# Patient Record
Sex: Female | Born: 1935 | Race: White | Hispanic: No | Marital: Single
Health system: Southern US, Community
[De-identification: ages and names within clinical notes are randomized; demographics above are authoritative.]

---

## 2003-02-05 ENCOUNTER — Other Ambulatory Visit: Payer: Self-pay

## 2003-02-06 ENCOUNTER — Other Ambulatory Visit: Payer: Self-pay

## 2004-03-25 ENCOUNTER — Ambulatory Visit: Payer: Self-pay | Admitting: Family Medicine

## 2005-07-16 ENCOUNTER — Emergency Department: Payer: Self-pay | Admitting: Emergency Medicine

## 2006-05-04 ENCOUNTER — Ambulatory Visit: Payer: Self-pay | Admitting: Family Medicine

## 2007-12-28 ENCOUNTER — Ambulatory Visit: Payer: Self-pay | Admitting: Family Medicine

## 2008-01-03 ENCOUNTER — Ambulatory Visit: Payer: Self-pay | Admitting: Ophthalmology

## 2008-01-18 ENCOUNTER — Ambulatory Visit: Payer: Self-pay | Admitting: Ophthalmology

## 2008-10-25 ENCOUNTER — Ambulatory Visit: Payer: Self-pay | Admitting: Cardiology

## 2008-12-31 ENCOUNTER — Ambulatory Visit: Payer: Self-pay | Admitting: Family Medicine

## 2010-01-27 ENCOUNTER — Inpatient Hospital Stay: Payer: Self-pay | Admitting: Orthopedic Surgery

## 2010-07-03 ENCOUNTER — Ambulatory Visit: Payer: Self-pay | Admitting: Ophthalmology

## 2010-07-15 ENCOUNTER — Ambulatory Visit: Payer: Self-pay | Admitting: Ophthalmology

## 2012-03-31 DIAGNOSIS — I4891 Unspecified atrial fibrillation: Secondary | ICD-10-CM

## 2012-03-31 DIAGNOSIS — K625 Hemorrhage of anus and rectum: Secondary | ICD-10-CM

## 2012-03-31 DIAGNOSIS — I10 Essential (primary) hypertension: Secondary | ICD-10-CM

## 2012-03-31 DIAGNOSIS — M159 Polyosteoarthritis, unspecified: Secondary | ICD-10-CM

## 2012-04-07 DIAGNOSIS — L97309 Non-pressure chronic ulcer of unspecified ankle with unspecified severity: Secondary | ICD-10-CM

## 2012-04-29 DIAGNOSIS — H6120 Impacted cerumen, unspecified ear: Secondary | ICD-10-CM

## 2012-05-26 DIAGNOSIS — I4891 Unspecified atrial fibrillation: Secondary | ICD-10-CM

## 2012-05-26 DIAGNOSIS — I10 Essential (primary) hypertension: Secondary | ICD-10-CM

## 2012-05-26 DIAGNOSIS — I872 Venous insufficiency (chronic) (peripheral): Secondary | ICD-10-CM

## 2012-05-26 DIAGNOSIS — M159 Polyosteoarthritis, unspecified: Secondary | ICD-10-CM

## 2012-06-06 ENCOUNTER — Observation Stay: Payer: Self-pay | Admitting: Internal Medicine

## 2012-06-06 LAB — COMPREHENSIVE METABOLIC PANEL
Alkaline Phosphatase: 80 U/L (ref 50–136)
Co2: 26 mmol/L (ref 21–32)
Creatinine: 0.54 mg/dL — ABNORMAL LOW (ref 0.60–1.30)
EGFR (African American): >60
Osmolality: 284 (ref 275–301)
SGPT (ALT): 17 U/L (ref 12–78)
Sodium: 141 mmol/L (ref 136–145)
Total Protein: 6.1 g/dL — ABNORMAL LOW (ref 6.4–8.2)

## 2012-06-06 LAB — URINALYSIS, COMPLETE
Bilirubin,UR: NEGATIVE
Blood: NEGATIVE
Ketone: NEGATIVE
Ph: 5 (ref 4.5–8.0)

## 2012-06-06 LAB — CBC
HCT: 41.4 % (ref 35.0–47.0)
HGB: 14 g/dL (ref 12.0–16.0)
MCH: 33.5 pg (ref 26.0–34.0)
MCHC: 33.9 g/dL (ref 32.0–36.0)
Platelet: 172 10*3/uL (ref 150–440)
RDW: 13.7 % (ref 11.5–14.5)
WBC: 4.9 10*3/uL (ref 3.6–11.0)

## 2012-06-06 LAB — TROPONIN I: Troponin-I: <0.02 ng/mL

## 2012-06-06 LAB — PROTIME-INR: INR: 2

## 2012-06-08 DIAGNOSIS — R0902 Hypoxemia: Secondary | ICD-10-CM

## 2012-06-08 DIAGNOSIS — I6789 Other cerebrovascular disease: Secondary | ICD-10-CM

## 2012-06-08 DIAGNOSIS — M129 Arthropathy, unspecified: Secondary | ICD-10-CM

## 2012-06-10 DIAGNOSIS — Z5181 Encounter for therapeutic drug level monitoring: Secondary | ICD-10-CM

## 2012-06-24 DIAGNOSIS — Z5181 Encounter for therapeutic drug level monitoring: Secondary | ICD-10-CM

## 2012-07-05 ENCOUNTER — Ambulatory Visit: Payer: Self-pay | Admitting: Internal Medicine

## 2012-07-14 DIAGNOSIS — F411 Generalized anxiety disorder: Secondary | ICD-10-CM

## 2012-07-14 DIAGNOSIS — I10 Essential (primary) hypertension: Secondary | ICD-10-CM

## 2012-07-14 DIAGNOSIS — F0150 Vascular dementia without behavioral disturbance: Secondary | ICD-10-CM

## 2012-07-14 DIAGNOSIS — I4891 Unspecified atrial fibrillation: Secondary | ICD-10-CM

## 2012-07-19 DIAGNOSIS — L97309 Non-pressure chronic ulcer of unspecified ankle with unspecified severity: Secondary | ICD-10-CM

## 2012-08-03 DIAGNOSIS — Z5181 Encounter for therapeutic drug level monitoring: Secondary | ICD-10-CM

## 2012-09-12 ENCOUNTER — Encounter (HOSPITAL_BASED_OUTPATIENT_CLINIC_OR_DEPARTMENT_OTHER): Payer: Medicare Other | Attending: General Surgery

## 2012-09-12 DIAGNOSIS — L97309 Non-pressure chronic ulcer of unspecified ankle with unspecified severity: Secondary | ICD-10-CM | POA: Insufficient documentation

## 2012-09-12 DIAGNOSIS — I10 Essential (primary) hypertension: Secondary | ICD-10-CM | POA: Insufficient documentation

## 2012-09-12 DIAGNOSIS — I739 Peripheral vascular disease, unspecified: Secondary | ICD-10-CM | POA: Insufficient documentation

## 2012-09-12 DIAGNOSIS — Z79899 Other long term (current) drug therapy: Secondary | ICD-10-CM | POA: Insufficient documentation

## 2012-09-12 DIAGNOSIS — Z7901 Long term (current) use of anticoagulants: Secondary | ICD-10-CM | POA: Insufficient documentation

## 2012-09-12 DIAGNOSIS — I96 Gangrene, not elsewhere classified: Secondary | ICD-10-CM | POA: Insufficient documentation

## 2012-09-13 ENCOUNTER — Inpatient Hospital Stay: Payer: Self-pay | Admitting: Internal Medicine

## 2012-09-13 LAB — URINALYSIS, COMPLETE
Bacteria: NONE SEEN
Bilirubin,UR: NEGATIVE
Blood: NEGATIVE
Glucose,UR: NEGATIVE mg/dL (ref 0–75)
Hyaline Cast: 3
Nitrite: NEGATIVE
Ph: 6 (ref 4.5–8.0)
Protein: NEGATIVE
Specific Gravity: 1.016 (ref 1.003–1.030)

## 2012-09-13 LAB — COMPREHENSIVE METABOLIC PANEL
Alkaline Phosphatase: 83 U/L (ref 50–136)
Anion Gap: 4 — ABNORMAL LOW (ref 7–16)
BUN: 12 mg/dL (ref 7–18)
Calcium, Total: 8.4 mg/dL — ABNORMAL LOW (ref 8.5–10.1)
Chloride: 103 mmol/L (ref 98–107)
Creatinine: 0.62 mg/dL (ref 0.60–1.30)
Potassium: 2.5 mmol/L — CL (ref 3.5–5.1)
SGOT(AST): 25 U/L (ref 15–37)
SGPT (ALT): 22 U/L (ref 12–78)
Total Protein: 6.6 g/dL (ref 6.4–8.2)

## 2012-09-13 LAB — CBC
HCT: 36.6 % (ref 35.0–47.0)
HGB: 12.4 g/dL (ref 12.0–16.0)
MCH: 32.2 pg (ref 26.0–34.0)
RBC: 3.85 10*6/uL (ref 3.80–5.20)

## 2012-09-13 NOTE — Progress Notes (Signed)
[facility]  NAME:  Pamela Cortez, Pamela Cortez NO.:  [id number]  MEDICAL RECORD NO.:  [id number]      DATE OF BIRTH:  07-Jun-1915  PHYSICIAN:  Ardath Sax, M.D.      VISIT DATE:  09/12/2012                                  OFFICE VISIT   HISTORY:  This is a 79 year old lady who came here because of gangrenous ulcer on the posterior aspect of her left leg that involves the Achilles tendon.  It is frankly necrotic, black, and the ulcer is a good 6-cm long and 4-cm wide.  I debrided a lot of the Achilles tendon that was grossly black in color, and really got very little bleeding.  She has no peripheral pulses and obviously has peripheral vascular disease, it should expect on a 79 year old lady.  She does not have diabetes and is very sharp mentally and is not very mobile, however, she does not walk. She spends a day either on a wheelchair or her bed.  She is in a nursing home and is seen very often by her niece.  MEDICATIONS:  Her medicines are just Cardizem for hypertension.  She is also on Coumadin 4 mg 5 days a week.  She is on vitamins and Toprol-XL, Zocor, Celexa, and Senokot-S.  She came here today and had a blood pressure 125/89, pulse 58, respirations 16, temperature 98.1.  She is 5 feet 2 inches, weighs 110 pounds.  After debriding this ulcer, I really felt it was useless to have her continue coming to the [facility].  I think that this leg will have to be amputated and so we sent her to Orthopedics as this leg has very poor blood supply, has a gangrenous ulcer of the left ankle involving all of the Achilles tendon.  We put it in a Santyl dressing, and we are trying to get her an appointment for Orthopedics to consult with [place] on this case.     Ardath Sax, M.D.     PP/MEDQ  D:  09/12/2012  T:  09/13/2012  Job:  [id number]

## 2012-09-14 LAB — BASIC METABOLIC PANEL
BUN: 8 mg/dL (ref 7–18)
Calcium, Total: 8.1 mg/dL — ABNORMAL LOW (ref 8.5–10.1)
Chloride: 106 mmol/L (ref 98–107)
Co2: 31 mmol/L (ref 21–32)
Creatinine: 0.58 mg/dL — ABNORMAL LOW (ref 0.60–1.30)
EGFR (African American): >60
EGFR (Non-African Amer.): >60
Glucose: 87 mg/dL (ref 65–99)
Osmolality: 281 (ref 275–301)

## 2012-09-14 LAB — TSH: Thyroid Stimulating Horm: 1.81 u[IU]/mL

## 2012-09-14 LAB — CBC WITH DIFFERENTIAL/PLATELET
Basophil #: 0 10*3/uL (ref 0.0–0.1)
Eosinophil #: 0.1 10*3/uL (ref 0.0–0.7)
HCT: 33.6 % — ABNORMAL LOW (ref 35.0–47.0)
HGB: 11.8 g/dL — ABNORMAL LOW (ref 12.0–16.0)
Lymphocyte %: 10.5 %
MCH: 33.5 pg (ref 26.0–34.0)
MCHC: 35.2 g/dL (ref 32.0–36.0)
Monocyte #: 0.5 x10 3/mm (ref 0.2–0.9)
Monocyte %: 8.8 %
Neutrophil #: 4.9 10*3/uL (ref 1.4–6.5)

## 2012-09-14 LAB — MAGNESIUM: Magnesium: 1.8 mg/dL

## 2012-09-14 LAB — HEMOGLOBIN A1C: Hemoglobin A1C: 5.1 % (ref 4.2–6.3)

## 2012-09-15 LAB — CBC WITH DIFFERENTIAL/PLATELET
Basophil %: 1 %
Eosinophil #: 0.2 10*3/uL (ref 0.0–0.7)
Eosinophil %: 2.5 %
HCT: 34.3 % — ABNORMAL LOW (ref 35.0–47.0)
Lymphocyte #: 0.6 10*3/uL — ABNORMAL LOW (ref 1.0–3.6)
Lymphocyte %: 8.7 %
MCHC: 35.3 g/dL (ref 32.0–36.0)
MCV: 95 fL (ref 80–100)
Monocyte #: 0.7 x10 3/mm (ref 0.2–0.9)
Monocyte %: 10.2 %
Platelet: 250 10*3/uL (ref 150–440)
RBC: 3.59 10*6/uL — ABNORMAL LOW (ref 3.80–5.20)
RDW: 15.8 % — ABNORMAL HIGH (ref 11.5–14.5)
WBC: 7.2 10*3/uL (ref 3.6–11.0)

## 2012-09-15 LAB — PROTIME-INR
INR: 4.7
Prothrombin Time: 42.2 secs — ABNORMAL HIGH (ref 11.5–14.7)

## 2012-09-15 LAB — POTASSIUM: Potassium: 3.2 mmol/L — ABNORMAL LOW (ref 3.5–5.1)

## 2012-09-16 LAB — VANCOMYCIN, TROUGH: Vancomycin, Trough: 6 ug/mL — ABNORMAL LOW (ref 10–20)

## 2012-09-16 LAB — PROTIME-INR: INR: 3.9

## 2012-09-16 LAB — POTASSIUM: Potassium: 4 mmol/L (ref 3.5–5.1)

## 2012-09-18 LAB — CULTURE, BLOOD (SINGLE)

## 2012-09-19 DIAGNOSIS — I96 Gangrene, not elsewhere classified: Secondary | ICD-10-CM

## 2012-09-19 DIAGNOSIS — I10 Essential (primary) hypertension: Secondary | ICD-10-CM

## 2012-09-19 DIAGNOSIS — M129 Arthropathy, unspecified: Secondary | ICD-10-CM

## 2012-09-22 ENCOUNTER — Ambulatory Visit: Payer: Self-pay | Admitting: Vascular Surgery

## 2012-09-22 LAB — BASIC METABOLIC PANEL
Anion Gap: 3 — ABNORMAL LOW (ref 7–16)
Calcium, Total: 8.5 mg/dL (ref 8.5–10.1)
Chloride: 105 mmol/L (ref 98–107)
Co2: 31 mmol/L (ref 21–32)
Creatinine: 0.6 mg/dL (ref 0.60–1.30)
EGFR (African American): >60
EGFR (Non-African Amer.): >60
Glucose: 88 mg/dL (ref 65–99)
Osmolality: 276 (ref 275–301)
Potassium: 3.9 mmol/L (ref 3.5–5.1)
Sodium: 139 mmol/L (ref 136–145)

## 2012-09-22 LAB — PROTIME-INR: Prothrombin Time: 16.6 secs — ABNORMAL HIGH (ref 11.5–14.7)

## 2012-10-10 DIAGNOSIS — L97509 Non-pressure chronic ulcer of other part of unspecified foot with unspecified severity: Secondary | ICD-10-CM

## 2012-10-14 DIAGNOSIS — Z5181 Encounter for therapeutic drug level monitoring: Secondary | ICD-10-CM

## 2012-10-27 DIAGNOSIS — L97509 Non-pressure chronic ulcer of other part of unspecified foot with unspecified severity: Secondary | ICD-10-CM

## 2012-11-02 DIAGNOSIS — F3289 Other specified depressive episodes: Secondary | ICD-10-CM

## 2012-11-02 DIAGNOSIS — F329 Major depressive disorder, single episode, unspecified: Secondary | ICD-10-CM

## 2012-11-02 DIAGNOSIS — F411 Generalized anxiety disorder: Secondary | ICD-10-CM

## 2012-11-02 DIAGNOSIS — I4891 Unspecified atrial fibrillation: Secondary | ICD-10-CM

## 2012-11-02 DIAGNOSIS — R443 Hallucinations, unspecified: Secondary | ICD-10-CM

## 2012-11-02 DIAGNOSIS — F0150 Vascular dementia without behavioral disturbance: Secondary | ICD-10-CM

## 2012-11-02 DIAGNOSIS — R634 Abnormal weight loss: Secondary | ICD-10-CM

## 2012-11-02 DIAGNOSIS — I10 Essential (primary) hypertension: Secondary | ICD-10-CM

## 2012-11-10 DIAGNOSIS — L97309 Non-pressure chronic ulcer of unspecified ankle with unspecified severity: Secondary | ICD-10-CM

## 2012-12-22 DIAGNOSIS — L97409 Non-pressure chronic ulcer of unspecified heel and midfoot with unspecified severity: Secondary | ICD-10-CM

## 2012-12-27 DIAGNOSIS — L02619 Cutaneous abscess of unspecified foot: Secondary | ICD-10-CM

## 2012-12-27 DIAGNOSIS — L97409 Non-pressure chronic ulcer of unspecified heel and midfoot with unspecified severity: Secondary | ICD-10-CM

## 2012-12-27 DIAGNOSIS — I4891 Unspecified atrial fibrillation: Secondary | ICD-10-CM

## 2012-12-27 DIAGNOSIS — L03119 Cellulitis of unspecified part of limb: Secondary | ICD-10-CM

## 2012-12-27 DIAGNOSIS — F0150 Vascular dementia without behavioral disturbance: Secondary | ICD-10-CM

## 2012-12-27 DIAGNOSIS — R55 Syncope and collapse: Secondary | ICD-10-CM

## 2013-01-16 DIAGNOSIS — L03119 Cellulitis of unspecified part of limb: Secondary | ICD-10-CM

## 2013-01-16 DIAGNOSIS — L02619 Cutaneous abscess of unspecified foot: Secondary | ICD-10-CM

## 2013-01-23 ENCOUNTER — Telehealth: Payer: Self-pay

## 2013-01-23 NOTE — Telephone Encounter (Signed)
See CAN fax in your in box. 

## 2013-01-23 NOTE — Telephone Encounter (Signed)
Reviewed status at [facility] this AM roxanol ordered

## 2013-01-23 NOTE — Telephone Encounter (Signed)
See second CAN fax in your box.

## 2013-01-24 DIAGNOSIS — L97909 Non-pressure chronic ulcer of unspecified part of unspecified lower leg with unspecified severity: Secondary | ICD-10-CM

## 2013-02-10 DIAGNOSIS — I96 Gangrene, not elsewhere classified: Secondary | ICD-10-CM

## 2013-02-10 DIAGNOSIS — E46 Unspecified protein-calorie malnutrition: Secondary | ICD-10-CM

## 2013-02-10 DIAGNOSIS — I739 Peripheral vascular disease, unspecified: Secondary | ICD-10-CM

## 2013-02-16 DEATH — deceased

## 2014-03-18 IMAGING — CR DG CHEST 1V PORT
1 series · 1 of 1 positions shown · non-contrast
Comparison: none

REASON FOR EXAM: sob
COMMENTS:

PROCEDURE:     DXR - DXR PORTABLE CHEST SINGLE VIEW  - September 13, 2012 [DATE]
RESULT:     Mediastinum and hilar structures normal. The lungs are clear.
Cardiomegaly. Degenerative changes both shoulders.

[ap]
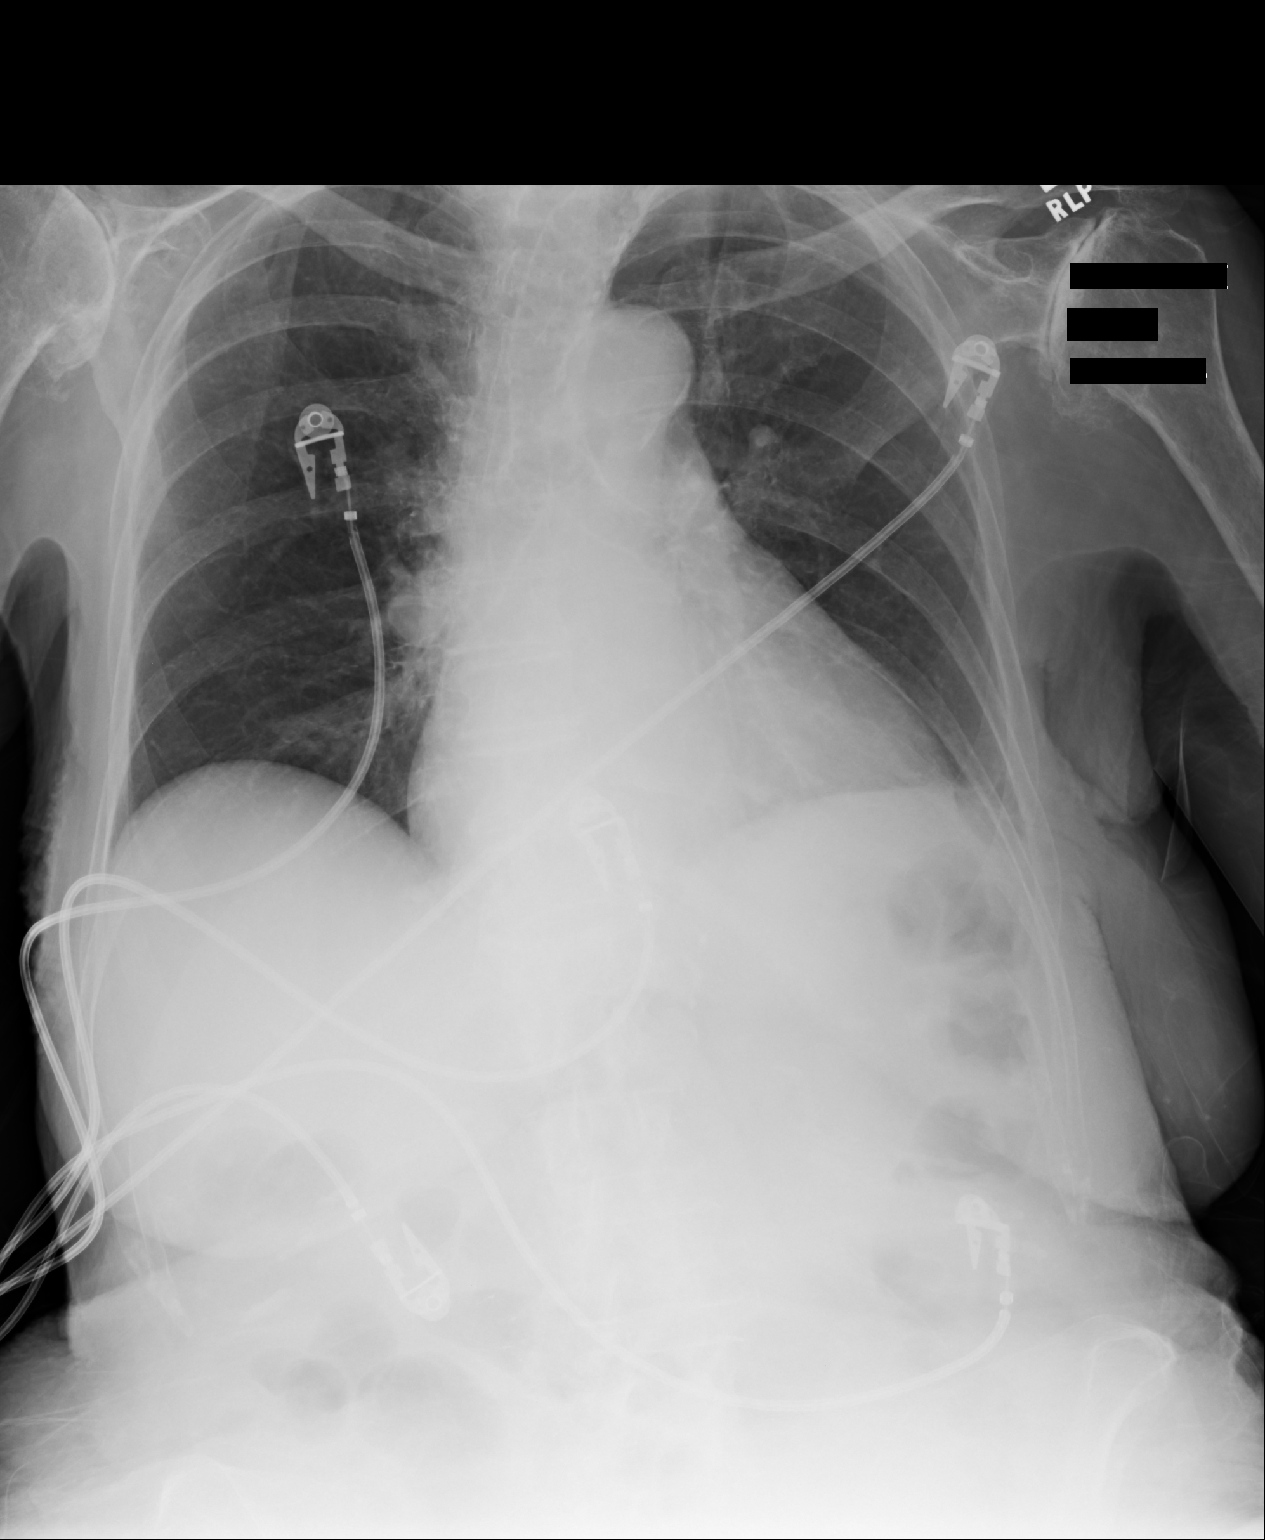

[1 of 1 positions shown; findings below may reference images not displayed]

IMPRESSION: Stable chest unchanged 06/06/2012. Severe cardiomegaly.

## 2014-06-08 NOTE — Op Note (Signed)
PATIENT NAME:  Pamela Cortez, Pamela Cortez MR#:  [id number] DATE OF BIRTH:  16-Oct-1915  DATE OF PROCEDURE:  09/22/2012  PREOPERATIVE DIAGNOSES:  1. Peripheral arterial disease with ulceration, right lower extremity.  2. Multiple right lower extremity ulcers in the foot and lower leg.  3. Atrial fibrillation.   POSTOPERATIVE DIAGNOSES:  1. Peripheral arterial disease with ulceration, right lower extremity.  2. Multiple right lower extremity ulcers in the foot and lower leg.  3. Atrial fibrillation.   PROCEDURES:  1. Catheter placement in the right anterior tibial artery from left femoral approach.  2. Aortogram and selective right lower extremity angiogram.  3. Percutaneous transluminal angioplasty of the anterior tibial artery and distal popliteal artery with 3 mm diameter angioplasty balloon.  4. Percutaneous transluminal angioplasty of right popliteal artery and superficial femoral artery with 5 mm diameter angioplasty balloon.  5. StarClose closure device, left femoral artery.   SURGEON: Annice Needy, MD   ANESTHESIA: Local with moderate conscious sedation.   ESTIMATED BLOOD LOSS: Minimal.   INDICATION FOR PROCEDURE: This is a 81 year old white female with multiple ulcers of the right lower extremity. She has profound ischemia of the right leg and is brought in for angiogram to see if revascularization is possible for an attempt at limb salvage. The risks and benefits were discussed. Informed consent was obtained   DESCRIPTION OF THE PROCEDURE: The patient was brought to the vascular interventional radiology suite. The groin was shaved and prepped, and a sterile surgical field was created. The left femoral head was localized with fluoroscopy, and the left femoral artery was accessed without difficulty with a Seldinger needle. A 5 French sheath was placed over a J-wire. A pigtail catheter was placed in the aorta at the L1-L2 level, and an aortogram was performed. This demonstrated no flow  limiting stenosis in the aortoiliac segments with some tortuosity. I then hooked the aortic bifurcation and advanced to the right femoral head, and selective right lower extremity angiogram was then performed. This demonstrated occlusion of the superficial femoral artery in the mid segment, which then reconstituted in the above-knee popliteal artery. The popliteal artery was then occluded below this, and the anterior tibial artery reconstituted several centimeters beyond its origin and was the dominant and only runoff to the foot. The patient was systemically heparinized. A 6 French Ansel sheath was placed over a Terumo Advantage wire. I was able to cross the lesion without difficulty with the Terumo Advantage wire and a Kumpe catheter and advance into the anterior tibial artery without difficulty. I then exchanged for an 0.018 wire and performed angioplasty of the anterior tibial artery and distal popliteal artery with a 3 mm diameter angioplasty balloon. A waist was taken, which resolved with angioplasty. I then used a 5 mm diameter angioplasty balloon to treat the separate and distinct lesion in the popliteal artery up to the mid to proximal superficial femoral artery. Again, waists were taken which resolved with angioplasty. Angiogram following angioplasty showed excellent flow through the superficial femoral artery, popliteal artery and the anterior tibial artery, without a significant residual stenosis or dissection. The runoff was maintained to the foot, and I elected to terminate the procedure. The sheath was pulled back to the ipsilateral external iliac artery and oblique arteriogram was performed. StarClose closure device was deployed in the usual fashion, with excellent hemostatic result. The patient tolerated the procedure well and was taken to the recovery room in stable condition.   ____________________________ Annice Needy, MD jsd:OSi D: 09/22/2012  11:55:46 ET T: 09/22/2012 12:02:14  ET JOB#: [id number]  cc: Annice NeedyJason S. Cainan Trull, MD, <Dictator> Annice NeedyJASON S Raghad Lorenz MD ELECTRONICALLY SIGNED 10/03/2012 16:06

## 2014-06-08 NOTE — Discharge Summary (Signed)
PATIENT NAME:  Pamela Cortez, Chardai L MR#:  [id number] DATE OF BIRTH:  May 24, 1915  DATE OF ADMISSION:  06/06/2012 DATE OF DISCHARGE:  06/07/2012  ADMISSION DIAGNOSES: Transient ischemic attack with aphasia.   DISCHARGE DIAGNOSES:  1. Subacute/acute left middle cerebral artery stroke.  2. Glaucoma.  3. Hypertension.   HOSPITAL COURSE: A 81 year old female who presented with aphasia, was found to have a subacute/acute stroke. For further details, please refer to the H and P.  1. Subacute/acute stroke. I think this is more of a subacute stroke. Her symptoms actually have resolved. She was on Coumadin with an INR of 2 on admission. Will continue the Coumadin. Her 2-D echocardiogram did not show evidence of thrombus and her ultrasound of the carotids also looked without any hemodynamic significance.  2. Hypertension. The patient was continued on outpatient medications.  3. Despite the stroke, this did seem like a subacute stroke.  4. Glaucoma. The patient is continued on outpatient medications.   IMAGING: 1. MRI of the brain showed region of a subacute/acute infarct in the MCA distribution on the left.  2. 2-D echocardiogram showed an ejection fraction of 50 to 55% with moderate aortic regurgitation and TR.  3. Carotid Doppler showed no evidence of hemodynamically significant stenosis.   DISCHARGE MEDICATIONS:  1. Cardizem 240 mg daily.  2. Azopt 1% to each affected eye two times a day.  3. Lumigan eye drops 0.1% to each eye daily.  4. Combigan one drop to each eye b.i.d.  5. Vitamin D 1 tablet  daily.  6. Metoprolol 50 mg daily.  7. Lasix 20 mg daily p.r.n.  8. Coumadin 4 mg daily.  9. Milk of Magnesia 30 mL b.i.d. p.r.n. constipation.  10. Tylenol 500 mg 2 tablets t.i.d.  11. Senna 1 tablet daily.  12. Zocor 10 mg daily.   DISCHARGE DIET: Low sodium diet.   DISCHARGE ACTIVITY: As tolerated.   FOLLOWUP: The patient will follow up with her primary care physician, Dr. Alphonsus SiasLetvak, in one  week.  TIME SPENT: Approximately 35 minutes.    ____________________________ Janyth ContesSital P. Juliene PinaMody, MD spm:es D: 06/07/2012 13:50:53 ET T: 06/07/2012 14:02:43 ET JOB#: [id number]  cc: Khyle Goodell P. Juliene PinaMody, MD, <Dictator> Karie Schwalbeichard I. Letvak, MD Janyth ContesSITAL P Endiya Klahr MD ELECTRONICALLY SIGNED 06/07/2012 14:20

## 2014-06-08 NOTE — Discharge Summary (Signed)
PATIENT NAME:  Pamela Cortez, Dru L MR#:  [id number] DATE OF BIRTH:  12/07/1915  DATE OF ADMISSION:  09/13/2012 DATE OF DISCHARGE:    DISCHARGE DIAGNOSES:  1.  Right ankle cellulitis, improving on antibiotics. Will likely need angiogram.  2.  Severe peripheral vascular disease for further evaluation on next Tuesday, 09/20/2012 as per vascular surgery.  3.  Hypokalemia, repleted and resolved.  4.  Acquired coagulopathy, on Coumadin, which was held until now. We will give her 1 more dose tonight and then will be stopped until her angiogram on the 08/05.  5.  Chronic atrial fibrillation, on Coumadin.   SECONDARY DIAGNOSES:  1.  Cerebrovascular accident.  2.  Chronic atrial fibrillation, on Coumadin.  3.  Hypertension.  4.  Glaucoma.   CONSULTATIONS:  1.  Podiatry, Dr. Graciela HusbandsKlein.  2.  Vascular surgery, Dr. Gilda CreaseSchnier.  3.  Physical therapy.   PROCEDURES/RADIOLOGY:  1.  Chest x-ray on the 07/29 showed severe cardiomegaly. No other acute changes.  2.  Right ankle x-ray showed no acute abnormality. Diffuse osteopenia with degenerative changes.   MAJOR LABORATORY PANEL:  1.  Urinalysis on admission was negative.  2.  Blood cultures x 2 were negative on the 07/29.   HISTORY AND SHORT HOSPITAL COURSE: The patient is a 81 year old female, who turned [age] today, it [birth year] her birthday today. She was admitted for right ankle cellulitis. Please see Dr. Nicky Pughhen's dictated history and physical for further details. Podiatry consultation was obtained with Dr. Graciela HusbandsKlein, who recommended vascular surgery evaluation for peripheral vascular disease and possible amputation or angiogram. Dr. Gilda CreaseSchnier from vascular surgery evaluated the patient and recommended angiography and intervention for the right leg. At this time, the patient and family were not quite ready. It  is her birthday today and they are in agreement to get the procedure done, but will be done likely as an outpatient after discussion with vascular surgery. The patient  is being discharged back to her facility with outpatient follow up with Dr. Gilda CreaseSchnier on 08/05 for angiogram and possible intervention. On the date of discharge, her vital signs are as follows: Temperature 97.7, heart rate 94 per minute, respirations 18 per minute, blood pressure 122/79 and she was saturating 94% on room air.  PERTINENT PHYSICAL EXAMINATION ON THE DATE OF DISCHARGE: CARDIOVASCULAR: S1, S2 normal. No murmurs, rubs, or gallop.  LUNGS: Clear to auscultation bilaterally. No wheezing, rales, rhonchi or crepitation.  ABDOMEN: Soft, benign.  NEUROLOGIC: Nonfocal examination.  EXTREMITIES: Her right foot exam showed skin, which was clean, dry and atrophic with diminished hair growth. She had a large full thickness ulceration with exposed tendon on the posterior aspect of the right ankle, about 7 mm length. She also had a significant drainage and odor was reduced compared to when she came in. She also had ulceration on the anterior aspect of the right ankle as well as over the medial malleolus and dry eschar present also on the right hallux per podiatry. All other physical examination remained at baseline.   DISCHARGE MEDICATIONS:  1.  Cardizem CD 240 mg p.o. daily.  2.  Azopt 1% drop to each eye twice a day.  3.  Lumigan 0.01% ophthalmic solution to each affected eye in the evening. 4.  Combigan 1 drop to each eye twice a day.  5.  Vitamin D2 50,000 International Units once a month.  6.  Toprol-XL 50 mg p.o. daily.  7.  Lasix 20 mg p.o. daily as needed.  8.  Senna 1 tablet  p.o. daily.  9.  Simvastatin 10 mg p.o. at bedtime.  10. Debrox otic solution 5 drops to each affected area every 2 weeks as needed.  11. Citalopram 10 mg p.o. daily.  12. Multivitamin once daily.  13. Tylenol 1000 mg p.o. 3 times a day as needed.  14. Milk of magnesia 30 p.o. twice a day as needed. 15. Coumadin 4 mg p.o. once tonight and then to be stopped for her planned angiogram on 09/20/2012. Subsequent dosing  per Dr. Gilda Crease after procedure.  16. Augmentin 875/125 mg 1 tablet p.o. b.i.d. for 10 days.   DISCHARGE DIET: Low sodium.   DISCHARGE ACTIVITY: As tolerated.   DISCHARGE INSTRUCTIONS AND FOLLOWUP. The patient was instructed to follow up with her primary care physician, Dr. Tera Mater, in 1 to 2 weeks. She will need to come back on 08/05 at the vascular lab see Dr. Gilda Crease for possible angiogram and intervention.   TOTAL TIME DISCHARGING THIS PATIENT: 55 minutes.  ____________________________ Ellamae Sia. Sherryll Burger, MD vss:aw D: 09/16/2012 11:26:47 ET T: 09/16/2012 11:55:00 ET JOB#: [id number]  cc: Mikael Debell S. Sherryll Burger, MD, <Dictator> TODD Graciela Husbands, MD Rhona Leavens. Burnett Sheng, MD Renford Dills, MD Patricia Pesa MD ELECTRONICALLY SIGNED 09/18/2012 16:52

## 2014-06-08 NOTE — Consult Note (Signed)
General Aspect gangrene and ulceration of teh right foot and ankle   Present Illness The patient is a 81 year old female with a recent CVA, chronic a-fib, hypertension, glaucoma who was brought to the ED from the nursing home yesterday due to right ankle wound and infection. The patient is alert, awake, oriented, in no acute distress.   According to the chart, the patient has had a right ankle wound for a month but it recently got infected for the past few days. The patient was sent by Dr. Elvina Mattes for right ankle wound infection.   INR was 6.6. The patient was treated with vanc, Zosyn in the ED.  PAST MEDICAL HISTORY: CVA, chronic a-fib, hypertension, glaucoma   Home Medications: Medication Instructions Status  simvastatin 10 mg oral tablet 1 tab(s) orally once a day (at bedtime) Active  Coumadin 2 mg oral tablet 1 tab(s) orally once a day on Mon, and Thurs Active  Debrox 6.5% otic solution 5 drop(s) to each affected ear every 2 weeks, As Needed Active  citalopram 10 mg oral tablet 1 tab(s) orally once a day Active  multivitamin 1 tab(s) orally once a day Active  Tylenol 500 mg oral tablet 2 tab(s) orally 3 times a day, As Needed Active  Milk of Magnesia 8% oral suspension 30 milliliter(s) orally 2 times a day, As Needed Active  Cardizem CD 240 mg/24 hours oral capsule, extended release 1 cap(s) orally once a day Active  Azopt 1% ophthalmic suspension 1 drop(s) to each affected eye 2 times a day Active  Lumigan 0.01% ophthalmic solution 1 drop(s) to each affected eye once a day (in the evening) Active  Combigan 0.2%-0.5% ophthalmic solution 1 drop(s) to each affected eye 2 times a day Active  Vitamin D2 50,000 intl units oral capsule 1 cap(s) orally once a month Active  Toprol-XL 50 mg oral tablet, extended release 1 tab(s) orally once a day Active  furosemide 20 mg oral tablet 1 tab(s) orally once a day, As Needed Active  Coumadin 4 mg oral tablet 1 tab(s) orally once a day on Tues, Wed,  Fri, Sat, and Sunday Active  Senna S 50 mg-8.6 mg oral tablet 1 tab(s) orally once a day Active    No Known Allergies:   Case History:  Family History Non-Contributory   Social History negative tobacco, negative ETOH, negative Illicit drugs   Review of Systems:  Fever/Chills No   Cough No   Sputum No   Abdominal Pain No   Diarrhea No   Constipation No   Nausea/Vomiting No   SOB/DOE No   Chest Pain No   Telemetry Reviewed Afib   Physical Exam:  GEN well developed, well nourished, no acute distress   HEENT red conjunctivae, hard of hearing   NECK supple  trachea midline   RESP normal resp effort  no use of accessory muscles   CARD regular rate  no JVD   ABD denies tenderness  soft   GU foley catheter in place  bloody urine   EXTR negative cyanosis/clubbing, negative edema, multiple wounds of the right foot and ankle pulses are nonpalapable   SKIN positive rashes, positive ulcers, skin turgor good   NEURO follows commands, motor/sensory function intact   PSYCH alert, a bit confused   Nursing/Ancillary Notes: **Vital Signs.:   30-Jul-14 05:32  Vital Signs Type Q 4hr  Temperature Temperature (F) 97.5  Celsius 36.3  Temperature Source oral  Pulse Pulse 86  Respirations Respirations 18  Systolic BP Systolic  BP 356  Diastolic BP (mmHg) Diastolic BP (mmHg) 75  Mean BP 89  Pulse Ox % Pulse Ox % 97  Pulse Ox Activity Level  At rest  Oxygen Delivery Room Air/ 21 %   Thyroid:  30-Jul-14 04:48   Thyroid Stimulating Hormone 1.81 (0.45-4.50 (International Unit)  ----------------------- Pregnant patients have  different reference  ranges for TSH:  - - - - - - - - - -  Pregnant, first trimetser:  0.36 - 2.50 uIU/mL)  Hepatic:  29-Jul-14 10:39   Bilirubin, Total 0.3  Alkaline Phosphatase 83  SGPT (ALT) 22  SGOT (AST) 25  Total Protein, Serum 6.6  Albumin, Serum  2.4  Routine Micro:  29-Jul-14 10:39   Micro Text Report BLOOD CULTURE   COMMENT                    NO GROWTH IN 18-24 HOURS   ANTIBIOTIC                       Culture Comment NO GROWTH IN 18-24 HOURS  Result(s) reported on 14 Sep 2012 at 12:00PM.    11:17   Micro Text Report BLOOD CULTURE   COMMENT                   NO GROWTH IN 18-24 HOURS   ANTIBIOTIC                       Culture Comment NO GROWTH IN 18-24 HOURS  Result(s) reported on 14 Sep 2012 at 12:00PM.  Cardiology:  29-Jul-14 10:10   Ventricular Rate 103  Atrial Rate 89  QRS Duration 144  QT 410  QTc 537  R Axis -75  T Axis -38  ECG interpretation Atrial fibrillation with rapid ventricular response Right bundle branch block Left anterior fascicular block *** Bifascicular block *** Septal infarct , age undetermined Abnormal ECG No previous ECGs available ----------unconfirmed---------- Confirmed by OVERREAD, NOT ([id number]), editor PEARSON, BARBARA ([id number]) on 09/14/2012 2:35:28 PM  Routine Chem:  29-Jul-14 10:39   Glucose, Serum  108  BUN 12  Creatinine (comp) 0.62  Sodium, Serum 139  Potassium, Serum  2.5  Chloride, Serum 103  CO2, Serum 32  Calcium (Total), Serum  8.4  Anion Gap  4  Osmolality (calc) 278  eGFR (African American) >60  eGFR (Non-African American) >60 (eGFR values <21m/min/1.73 m2 may be an indication of chronic kidney disease (CKD). Calculated eGFR is useful in patients with stable renal function. The eGFR calculation will not be reliable in acutely ill patients when serum creatinine is changing rapidly. It is not useful in  patients on dialysis. The eGFR calculation may not be applicable to patients at the low and high extremes of body sizes, pregnant women, and vegetarians.)  Result Comment POTASSIUM - NOTIFIED OF CRITICAL VALUE  - RESULTS VERIFIED BY REPEAT TESTING.  - READ-BACK PROCESS PERFORMED.  - C/DR.GOTTLIEB 09-13-12 1120 SJL  Result(s) reported on 13 Sep 2012 at 11:08AM.  Result Comment INR - RESULTS VERIFIED BY REPEAT TESTING.  - NOTIFIED OF CRITICAL VALUE  -  C/TO DR. GOTTLIEB @ 1127 09/13/12..Marland KitchenDF  - READ-BACK PROCESS PERFORMED.  Result(s) reported on 13 Sep 2012 at 11:28AM.  30-Jul-14 04:48   Glucose, Serum 87  BUN 8  Creatinine (comp)  0.58  Sodium, Serum 142  Potassium, Serum  2.7  Chloride, Serum 106  CO2, Serum 31  Calcium (Total), Serum  8.1  Anion Gap  5  Osmolality (calc) 281  eGFR (African American) >60  eGFR (Non-African American) >60 (eGFR values <45m/min/1.73 m2 may be an indication of chronic kidney disease (CKD). Calculated eGFR is useful in patients with stable renal function. The eGFR calculation will not be reliable in acutely ill patients when serum creatinine is changing rapidly. It is not useful in  patients on dialysis. The eGFR calculation may not be applicable to patients at the low and high extremes of body sizes, pregnant women, and vegetarians.)  Magnesium, Serum 1.8 (1.8-2.4 THERAPEUTIC RANGE: 4-7 mg/dL TOXIC: > 10 mg/dL  -----------------------)  Hemoglobin A1c ([facility]) 5.1 (The American Diabetes Association recommends that a primary goal of therapy should be <7% and that physicians should reevaluate the treatment regimen in patients with HbA1c values consistently >8%.)  Cardiac:  29-Jul-14 10:39   Troponin I < 0.02 (0.00-0.05 0.05 ng/mL or less: NEGATIVE  Repeat testing in 3-6 hrs  if clinically indicated. >0.05 ng/mL: POTENTIAL  MYOCARDIAL INJURY. Repeat  testing in 3-6 hrs if  clinically indicated. NOTE: An increase or decrease  of 30% or more on serial  testing suggests a  clinically important change)  Routine UA:  29-Jul-14 10:39   Color (UA) Yellow  Clarity (UA) Clear  Glucose (UA) Negative  Bilirubin (UA) Negative  Ketones (UA) Negative  Specific Gravity (UA) 1.016  Blood (UA) Negative  pH (UA) 6.0  Protein (UA) Negative  Nitrite (UA) Negative  Leukocyte Esterase (UA) Negative (Result(s) reported on 13 Sep 2012 at 11:26AM.)  RBC (UA) NONE SEEN  WBC (UA) 1 /HPF  Bacteria (UA) NONE  SEEN  Epithelial Cells (UA) <1 /HPF  Transitional Epithelial (UA) <1 /HPF  Mucous (UA) PRESENT  Hyaline Cast (UA) 3 /LPF (Result(s) reported on 13 Sep 2012 at 11:26AM.)  Routine Coag:  29-Jul-14 10:39   Prothrombin  53.9  INR  6.6 (INR reference interval applies to patients on anticoagulant therapy. A single INR therapeutic range for coumarins is not optimal for all indications; however, the suggested range for most indications is 2.0 - 3.0. Exceptions to the INR Reference Range may include: Prosthetic heart valves, acute myocardial infarction, prevention of myocardial infarction, and combinations of aspirin and anticoagulant. The need for a higher or lower target INR must be assessed individually. Reference: The Pharmacology and Management of the Vitamin K  antagonists: the seventh ACCP Conference on Antithrombotic and Thrombolytic Therapy. CYTKZS.0109Sept:126 (3suppl): [id number] A HCT value >55% may artifactually increase the PT.  In one study,  the increase was an average of 25%. Reference:  "Effect on Routine and Special Coagulation Testing Values of Citrate Anticoagulant Adjustment in Patients with High HCT Values." American Journal of Clinical Pathology 2006;126:400-405.)  Routine Hem:  29-Jul-14 10:39   WBC (CBC) 9.0  RBC (CBC) 3.85  Hemoglobin (CBC) 12.4  Hematocrit (CBC) 36.6  Platelet Count (CBC) 295 (Result(s) reported on 13 Sep 2012 at 11:05AM.)  MCV 95  MCH 32.2  MCHC 33.9  RDW  15.9  30-Jul-14 04:48   WBC (CBC) 6.2  RBC (CBC)  3.52  Hemoglobin (CBC)  11.8  Hematocrit (CBC)  33.6  Platelet Count (CBC) 248  MCV 95  MCH 33.5  MCHC 35.2  RDW  16.3  Neutrophil % 78.6  Lymphocyte % 10.5  Monocyte % 8.8  Eosinophil % 1.3  Basophil % 0.8  Neutrophil # 4.9  Lymphocyte #  0.6  Monocyte # 0.5  Eosinophil # 0.1  Basophil # 0.0 (Result(s) reported on 14 Sep 2012 at 05:59AM.)   XRay:    29-Jul-14 11:29, Ankle Right AP and Lateral  Ankle Right AP and Lateral    REASON FOR EXAM:    r ankle pain  COMMENTS:       PROCEDURE: DXR - DXR ANKLE RIGHT AP AND LATERAL  - Sep 13 2012 11:29AM     RESULT: Diffuse osteopenia. No fracture. Vascular calcification present.    IMPRESSION:  No acute abnormality. Diffuse osteopenia degenerative change.        Verified By: Osa Craver, M.D., MD    Impression 1.  ASO with ulceration           The patient has tissue loss now associated with infection.  Her GFR is normal.  I recommend angiography and intervention of the right leg.  Her cousin is not her at this time as there has been a death in the family.  I will try to speak with the patient and her cousin tomorrow at suppertime. 2.  Ulceration of the ankle and cellulits            continue ABx            wound care per Dr Elvina Mattes 3.  Coagulopathy.              patient's INR is being corrected 4.  Chronic atrial fibrillation.              continue coumadin once INR in range 5.  Hypertension.               continue home meds 6.  Cerebrovascular accident.               continue coumadin secondary to afib   Plan level 5   Electronic Signatures: Hortencia Pilar (MD)  (Signed 30-Jul-14 15:46)  Authored: General Aspect/Present Illness, Home Medications, Allergies, History and Physical Exam, Vital Signs, Labs, Radiology, Impression/Plan   Last Updated: 30-Jul-14 15:46 by Hortencia Pilar (MD)

## 2014-06-08 NOTE — Consult Note (Signed)
PATIENT NAME:  Pamela Cortez, Pamela Cortez MR#:  [id number] DATE OF BIRTH:  [birth year]  DATE OF CONSULTATION:  09/13/2012  CONSULTING PHYSICIAN:  Linus Galas, DPM  REASON FOR CONSULTATION: This is a 81 year old female who was admitted today for  evaluation and treatment of an ulcer on her right ankle. According to the patient and her family, the sore has been present for about the last 4 to 6 weeks. Thinks it started when a sock was rubbing on the back of her leg. Basically dealt with it for quite some time and then was referred to the wound center for evaluation who then sent her to the Emergency Department for admission. She has not really had any treatment at this point of the ulceration.   PAST MEDICAL HISTORY: CVA, heart disease with chronic Afib, hypertension, glaucoma.   ALLERGIES: None.   HOME MEDICATIONS:  1.  Vitamin D 2,  50,000 international units 1 capsule once a month.  2.  Tylenol 500 mg p.o. 2 tablets t.i.d. p.r.n.  3.  Toprol-XL 50 mg p.o. once daily.  4.  Zocor 10 mg p.o. at bedtime.  5.  Senna-S 50 mg/8.6 mg tablet once a day.  6.  Multivitamin 1 tablet daily.  7.  Glucose magnesium 30 mL twice a day p.r.n.  8.  Lumigan 0.01% one drop to each affected eye in the evening daily.  9.  Lasix 20 mg p.o. 1 tablet once a day p.r.n.  10.   Debrox 6.5% otic solution 5 drops to each affected ear every two weeks p.r.n.  11.  Coumadin 4 mg p.o. once daily on Tuesday, Wednesday, Friday, Saturday, and Sunday.  12.  Coumadin 2 mg one tablet once a day on Monday and Thursday.  13.  Combigan 0.2% to 0.5% one drop to each affected eye twice daily.  14.  Sevoflurane 10 mg p.o. once a day.  15.  Cardizem CD 240 mg one capsule daily.  16.  Azopt 1% ophthalmic suspension one drop to each affected eye twice daily.   FAMILY HISTORY: Hypertension.   SOCIAL HISTORY: She lives at [facility]. No alcohol or tobacco use.   REVIEW OF SYSTEMS:  CONSTITUTIONAL: Does not really specify fever or chills.  Denies headache or dizziness. Some generalized weakness.  EYES: No double vision, blurred vision, but has glaucoma.  ENT: No slurred speech or dysphagia.  CARDIOVASCULAR: Denies chest pain, palpitations, orthopnea or nocturnal dyspnea.  PULMONARY: Denies cough, shortness of breath.  GASTROINTESTINAL: No stomach pain, heartburn, nausea.  GENITOURINARY: Denies incontinence or dysuria.  SKIN: Draining sore on the back of her right ankle.  NEUROLOGICAL: Denies any numbness or paresthesias.   PHYSICAL EXAMINATION:  VASCULAR: DP and PT pulses could not be clearly palpated on the right side. Trace at best on the left. Capillary filling time is delayed.  NEUROLOGICAL: Does appear to have intact protective threshold with a monofilament wire  in both feet to the digits.  INTEGUMENT: The skin is thin, dry, and atrophic with diminished hair growth. There is a large full-thickness ulceration with exposed tendon on the posterior aspect of the right ankle approximately 7 mm in length. Some significant drainage and malodor. Also an ulceration on the anterior aspect of the right ankle as well as over the medial malleolus. A dry eschar ulceration is present on the right hallux as well with several small areas on the lesser toes.  MUSCULOSKELETAL: Muscle atrophy in the lower extremities. Muscle testing is deferred due to the ulcerative area on  the right leg.   X-RAYS: Multiple views of the right ankle reveal some soft-tissue loss over the posterior aspect of the ankle. Blood vessel calcification is noted along the anterior and posterior aspect of the leg with some smaller vessels in the heel. Possible erosive areas on the posterior aspect of the heel but just may be degenerative. No clear evidence of fracture or destructive change in the ankle.   IMPRESSION:  1.  Full-thickness ulceration with cellulitis, right ankle.  2.  Evidence for peripheral vascular disease.   PLAN: We will consult with vascular surgery for  evaluation on amputation versus any possible debridement. At this point would suspect that amputation would be a more viable option, as debridement would leave her with a mostly nonfunctional leg, most likely due to the involvement of the Achilles tendon. Dressing was reapplied to the right ankle.   We will wait for input from vascular surgery.    ____________________________ Linus Galasodd Evanthia Maund, DPM tc:np D: 09/13/2012 18:46:01 ET T: 09/13/2012 21:51:45 ET JOB#: [id number]  cc: Linus Galasodd Normon Pettijohn, DPM, <Dictator> Shelva Hetzer DPM ELECTRONICALLY SIGNED 09/15/2012 10:26

## 2014-06-08 NOTE — H&P (Signed)
PATIENT NAME:  Pamela Cortez, Pamela Cortez MR#:  [id number] DATE OF BIRTH:  Oct 06, 1915  DATE OF ADMISSION:  09/13/2012  PRIMARY CARE PHYSICIAN: Dr. Burnett ShengHedrick.  CHIEF COMPLAINT: Right ankle wound and infection for a few days.   HISTORY OF PRESENT ILLNESS: The patient is a 81 year old Caucasian female with a history of recent subacute CVA, chronic a-fib, hypertension, glaucoma who was brought to the ED from the nursing home due to right ankle wound and infection. The patient is alert, awake, oriented, in no acute distress.   According to the patient and the patient's cousin, the patient has had a right ankle wound for a month but it recently got infected for the past few days. The patient was sent by Dr. Orland Jarredroxler for right ankle wound infection.   The patient denies any fever or chills. No headache or dizziness. Denies any other symptoms. The patient has a very bad smell from the infected wound. She was noted to have a low potassium at 2.5. INR was 6.6. The patient was treated with vanc, Zosyn and potassium in the ED.  PAST MEDICAL HISTORY: CVA, chronic a-fib, hypertension, glaucoma   ALLERGIES: NONE.   SOCIAL HISTORY: No smoking or drinking, or illicit. [facility].   FAMILY HISTORY: Hypertension.   HOME MEDICATIONS:  1.  Vitamin D2 50,000 international units, 1 capsule once a month.  2.  Tylenol 500 mg p.o. 2 tablets p.o. t.i.d. p.r.n.  3.  Toprol-XL  50 mg p.o. once a day.  4.  Zocor 10 mg p.o. at bedtime.  5.  Senna S 50 mg/8.6 mg tablets once a day.  6.  Multivitamin, 1 tablet once a day.  7.  Glucose magnesium 30 mL twice a day, p.r.n.  8.  Lumigan 0.01%, 1 drop to each affected eye in the evening once a day.  9.  Lasix 20 mg p.o. 1 tablet once a day, p.r.n.  10.  Debrox 6.5% otic solution, 5 drops to each affected ear every 2 weeks, p.r.n.  11.  Coumadin 4 mg p.o. once a day on Tuesday, Wednesday, Friday, Saturday and Sunday.  12.  Coumadin 2 mg, 1 tablet once a  day on Monday and Thursday.  13.  Combigan 0.2% to 0.5%, 1 drop to each affected eye twice a day.  14.  Sevoflurane 10 mg p.o. once a day.  15.  Cardizem CD 240 mg 1 capsule  once a day.  16.  Azopt 1% ophthalmic suspension, 1 drop to each affected eye twice a day.   REVIEW OF SYSTEMS:  CONSTITUTIONAL: The patient denies any fever or chills. No headache or dizziness, but has some weakness and poor oral intake. EYES: No double vision, blurred vision, but has glaucoma.  ENT: No postnasal drip, slurred speech or dysphagia.  CARDIOVASCULAR: No chest pain, palpitation, orthopnea, or nocturnal dyspnea. No leg edema.  PULMONARY: No cough, sputum, shortness of breath or hemoptysis.  GASTROINTESTINAL: No abdominal pain, nausea, vomiting or diarrhea. No melena or bloody stool.  GENITOURINARY: No dysuria, hematuria, or incontinence.  SKIN: No rash or jaundice.  NEUROLOGY: No syncope, loss of consciousness or seizure.  MUSCULOSKELETAL: Red ankle ulcers and right ankle wound infection. No muscle pain.  ENDOCRINOLOGY: No polyuria or polydipsia, heat or cold intolerance.  HEMATOLOGY: No easy bruising or bleeding.   PHYSICAL EXAMINATION: VITAL SIGNS: Temperature 97.8, blood pressure 98, pulse over 67, and pulse 90, respirations 16, O2 saturation 96% on room air.  GENERAL: The patient is  alert, awake, oriented, in no acute distress.  HEENT: Pupils round, equal and reactive to light and accommodation.  NECK: Supple. No JVD or carotid bruits are noted. No lymphadenopathy. No thyromegaly. Moist oral mucosa. Clear oropharynx.  CARDIOVASCULAR: S1, S2 regular. Irregular rate and rhythm. No murmurs, gallops.  PULMONARY: Bilateral air entry. No wheezing or rales. No use of accessory muscles to breathe.  ABDOMEN: Soft. No distention. No tenderness. No organomegaly. Bowel sounds present.  EXTREMITIES: No edema, clubbing or cyanosis. No calf tenderness, but has about 7 cm long ulcers on the back of the right ankle,  and also about a 3 cm ulcer in the front of the ankle with bad-smelling and yellowish discharge.  NEUROLOGIC: AAO x 3. No focal deficits.   LABORATORY DATA: Troponin less than 0.02. Urinalysis negative. Chest x-ray stable.   Right ankle, AP and lateral, shows no acute abnormality. Diffuse osteopenia; degenerative change.   Glucose 108, BUN 12, creatinine 0.6, sodium 139, potassium 2.5, chloride 103, bicarbonate 32, calcium 8.4.   WBC 9, hemoglobin 12.4, platelets 295, INR 6.6.   EKG showed a-fib with a rapid ventricular response at 103 BPM, with right bundle branch block and left anterior fascicular block.   IMPRESSIONS: 1.  Right ankle wound and infection.  2.  Hypokalemia.  3.  Coagulopathy.  4.  Chronic atrial fibrillation.  5.  Hypertension.  6.  Cerebrovascular accident.  7.  Glaucoma.   PLAN OF TREATMENT: 1. The patient will be admitted to medical floor with telemonitor. We will continue vanc and Zosyn and we will get a podiatry consult from Dr. Orland Jarred.  2.  For coagulopathy, we will hold Coumadin and follow up INR.  3.  For hypokalemia, potassium was given in the ED. We will follow up potassium level.  4. GI prophylaxis.   I discussed the patient's condition and plan of treatment with the patient's next of kin/power of attorney, her cousin.  PATIENT'S CODE STATUS IS DNR   Time spent: About fifty-eight minutes.    ____________________________ Shaune Pollack, MD qc:dm D: 09/13/2012 12:21:25 ET T: 09/13/2012 12:38:38 ET JOB#: [id number]  cc: Shaune Pollack, MD, <Dictator> Shaune Pollack MD ELECTRONICALLY SIGNED 09/14/2012 12:40

## 2014-06-08 NOTE — H&P (Signed)
PATIENT NAME:  Pamela Cortez, Pamela Cortez MR#:  [id number] DATE OF BIRTH:  25-Mar-1915  DATE OF ADMISSION:  06/06/2012  PRIMARY CARE PHYSICIAN:  Tillman Abide, MD  CHIEF COMPLAINT: Left-sided weakness and aphasia.   HISTORY OF PRESENT ILLNESS: This is a 81 year old female who presented to the hospital from [facility] skilled nursing facility due to acute aphasia and left-sided weakness. The patient was in her usual state of health when this morning she was having some left-sided weakness and the staff noted that she was a bit more confused than usual. She was sent over to the ER.  In the ER, she had a difficult time talking and was completely aphasic. This has since then now resolved. She is presently alert, awake and oriented to time, place and person. There was some suspicion for possible TIA/CVA therefore hospitalist services were contacted for further treatment and evaluation. The patient has a history of chronic atrial fibrillation and is already on Coumadin. INR is therapeutic at 2.0 presently. The patient denied any prodromal symptoms like any headache, nausea, vomiting, abdominal pain, fevers, chills, cough, or any associated symptoms presently.   REVIEW OF SYSTEMS: CONSTITUTIONAL: No documented fever. No weight gain. No weight loss.  EYES: No blurry or double vision.  ENT: No tinnitus. No postnasal drip. No redness of the oropharynx.  RESPIRATORY: No cough, no wheeze, no hemoptysis, no dyspnea.  CARDIOVASCULAR: No chest pain, no orthopnea, no palpitations, no syncope.  GASTROINTESTINAL: No nausea, no vomiting, no diarrhea, no abdominal pain, no melena or hematochezia.  GENITOURINARY: No dysuria and no hematuria.  ENDOCRINE: No polyuria or nocturia. No heat or cold intolerance. HEMATOLOGIC: No anemia, no bruising and no bleeding.  INTEGUMENTARY: No rashes. No lesions.  MUSCULOSKELETAL: No arthritis, no swelling and no gout.  NEUROLOGIC: No numbness. No tingling. No ataxia. Positive aphasia. No  seizure-type activity.  PSYCHIATRIC: No anxiety. No insomnia. No ADD.   PAST MEDICAL HISTORY: Consistent with chronic atrial fibrillation, hypertension and glaucoma.   ALLERGIES: No known drug allergies.   SOCIAL HISTORY: No smoking. No alcohol abuse. No illicit drug abuse. Currently resides at a skilled nursing facility, at [facility].   FAMILY HISTORY: Both mother and father died at the age of 32 plus. They both died from old age. Father did have asthma.   CURRENT MEDICATIONS:  1.  Coumadin 4 mg daily. 2.  Cardizem CD 240 mg daily. 3.  Combigan eye drops 1 drop b.i.d. 4.  Azopt drops 1 drop b.i.d. 5.  Lasix 20 mg daily as needed. 6.  Lumigan 0.01% one drop at bedtime. 7.  Milk of Magnesia 30 mL b.i.d. 8.  Nystatin to be applied b.i.d. as needed. 9.  Senokot 1 tab daily. 10.  Toprol 50 mg daily. 11.  Tylenol 1000 mg t.i.d. as needed. 12.  Vitamin D2 50,000 international units monthly.   PHYSICAL EXAMINATION:  VITAL SIGNS:  Temperature 97.7, pulse 96, respirations 24, blood pressure 102/75 and sats 96% on room air.  GENERAL: She is a pleasant appearing female in no apparent distress.  HEENT: Atraumatic, normocephalic. Her extraocular muscles are intact. Pupils are equal and reactive to light. Sclerae anicteric. No conjunctival injection. No pharyngeal erythema.  NECK: Supple. There is no jugular venous distention, no bruits, no lymphadenopathy and no thyromegaly.   HEART:  Irregular. No murmurs, rubs or clicks.  LUNGS: Clear to auscultation bilaterally. No rales, no rhonchi and no wheezes.  ABDOMEN: Soft, flat, nontender and nondistended. Has good bowel sounds. No hepatosplenomegaly appreciated.  EXTREMITIES: No evidence of any cyanosis, clubbing or peripheral edema. Has +2 pedal and radial pulses bilaterally.  NEUROLOGIC: The patient is alert, awake and oriented x 3. No other focal motor or sensory deficits appreciated bilaterally.  SKIN: Moist and warm with no rashes appreciated.   LYMPHATIC: There is no cervical or axillary lymphadenopathy.   LABORATORY AND DIAGNOSTICS:  Serum glucose 119, BUN 16, creatinine 0.5, sodium 141, potassium 4, chloride 110 and bicarbonate 26. LFTs are within normal limits. Troponin less than 0.02. White cell count is 4.9, hemoglobin 14.0, hematocrit 41.4 and platelet count 172. Urinalysis is within normal limits. INR is 2.0.   The patient did have a CT of the head done without contrast which showed mild atrophy, but no acute intracranial pathology. The patient also had a chest x-ray done which showed shallow inspiration, atelectasis versus infiltrate versus fibrotic changes within the left lung base, degenerative changes involving the glenohumeral joints.   ASSESSMENT AND PLAN: This is a 81 year old female with history of chronic atrial fibrillation, hypertension and glaucoma, who presents to the hospital with aphasia and left-sided weakness which has now resolved. Suspected to have a transient ischemic attack. 1.  Transient ischemic attack / cerebrovascular accident.  This is the likely diagnosis given the patient's transient symptoms, which have resolved. She had some questionable left-sided weakness and aphasia. She is currently asymptomatic. The patient has a history of chronic atrial fibrillation, already on Coumadin. INR is therapeutic. Therefore, the chances of her having a stroke are probably low. I will observe her overnight on telemetry. A CT of head is negative. We will get a MRI of her brain, get carotid duplex and 2-dimensional echocardiogram. We will check neuro checks q. 4 hours and follow. If her work-up is negative, she can likely be discharged home by tomorrow.  2.  Hypertension. I would continue Cardizem and continue Toprol. Presently hemodynamically stable.  3.  Chronic atrial fibrillation, currently rate controlled. Continue Toprol. Continue Cardizem. I would continue her Coumadin. INR is therapeutic.  4.  Glaucoma. Continue  Combivent, Lumigan and Azopt eye drops.          CODE STATUS: The patient is a DO NOT INTUBATE/DO NOT RESUSCITATE.   TIME SPENT ON ADMISSION:  50 minutes.  ____________________________ Rolly PancakeVivek J. Cherlynn KaiserSainani, MD vjs:sb D: 06/06/2012 14:33:55 ET T: 06/06/2012 14:43:43 ET JOB#: [id number]  cc: Rolly PancakeVivek J. Cherlynn KaiserSainani, MD, <Dictator> Houston SirenVIVEK J SAINANI MD ELECTRONICALLY SIGNED 06/06/2012 17:43
